# Patient Record
Sex: Male | Born: 1985 | Hispanic: Yes | Marital: Single | Smoking: Never smoker
Health system: Southern US, Community
[De-identification: ages and names within clinical notes are randomized; demographics above are authoritative.]

## PROBLEM LIST (undated history)

## (undated) DIAGNOSIS — F329 Major depressive disorder, single episode, unspecified: Secondary | ICD-10-CM

## (undated) DIAGNOSIS — F32A Depression, unspecified: Secondary | ICD-10-CM

---

## 2017-11-27 ENCOUNTER — Encounter (HOSPITAL_BASED_OUTPATIENT_CLINIC_OR_DEPARTMENT_OTHER): Payer: Self-pay | Admitting: Emergency Medicine

## 2017-11-27 ENCOUNTER — Emergency Department (HOSPITAL_BASED_OUTPATIENT_CLINIC_OR_DEPARTMENT_OTHER): Payer: Worker's Compensation

## 2017-11-27 ENCOUNTER — Emergency Department (HOSPITAL_BASED_OUTPATIENT_CLINIC_OR_DEPARTMENT_OTHER)
Admission: EM | Admit: 2017-11-27 | Discharge: 2017-11-27 | Disposition: A | Discharge status: Home / Self Care | Payer: Worker's Compensation | Attending: Physician Assistant | Admitting: Physician Assistant

## 2017-11-27 ENCOUNTER — Other Ambulatory Visit: Payer: Self-pay

## 2017-11-27 DIAGNOSIS — Y999 Unspecified external cause status: Secondary | ICD-10-CM | POA: Diagnosis not present

## 2017-11-27 DIAGNOSIS — Y9389 Activity, other specified: Secondary | ICD-10-CM | POA: Diagnosis not present

## 2017-11-27 DIAGNOSIS — Y929 Unspecified place or not applicable: Secondary | ICD-10-CM | POA: Diagnosis not present

## 2017-11-27 DIAGNOSIS — S3992XA Unspecified injury of lower back, initial encounter: Secondary | ICD-10-CM | POA: Diagnosis not present

## 2017-11-27 DIAGNOSIS — M545 Low back pain, unspecified: Secondary | ICD-10-CM

## 2017-11-27 DIAGNOSIS — X501XXA Overexertion from prolonged static or awkward postures, initial encounter: Secondary | ICD-10-CM | POA: Diagnosis not present

## 2017-11-27 HISTORY — DX: Major depressive disorder, single episode, unspecified: F32.9

## 2017-11-27 HISTORY — DX: Depression, unspecified: F32.A

## 2017-11-27 MED ORDER — KETOROLAC TROMETHAMINE 30 MG/ML IJ SOLN
30.0000 mg | Freq: Once | INTRAMUSCULAR | Status: AC
  Administered 2017-11-27: 30 mg via INTRAMUSCULAR
  Filled 2017-11-27: qty 1

## 2017-11-27 MED ORDER — ACETAMINOPHEN 500 MG PO TABS
1000.0000 mg | ORAL_TABLET | Freq: Once | ORAL | Status: AC
  Administered 2017-11-27: 1000 mg via ORAL
  Filled 2017-11-27: qty 2

## 2017-11-27 MED ORDER — IBUPROFEN 600 MG PO TABS: 600.0000 mg | ORAL_TABLET | Freq: Four times a day (QID) | ORAL | 0 refills | Status: AC | PRN

## 2017-11-27 MED ORDER — CYCLOBENZAPRINE HCL 5 MG PO TABS: 5.0000 mg | ORAL_TABLET | Freq: Two times a day (BID) | ORAL | 0 refills | Status: AC | PRN

## 2017-11-27 MED ORDER — DIAZEPAM 5 MG PO TABS
5.0000 mg | ORAL_TABLET | Freq: Once | ORAL | Status: AC
  Administered 2017-11-27: 5 mg via ORAL
  Filled 2017-11-27: qty 1

## 2017-11-27 MED FILL — CYCLOBENZAPRINE 5 MG TABLET: 5 | 10 days supply | Qty: 20 | Fill #0

## 2017-11-27 MED FILL — IBUPROFEN 600 MG TABLET: 600 | 8 days supply | Qty: 30 | Fill #0

## 2017-11-27 NOTE — ED Notes (Signed)
Patient transported to X-ray 

## 2017-11-27 NOTE — ED Notes (Signed)
ED Provider at bedside. 

## 2017-11-27 NOTE — ED Provider Notes (Signed)
[facility] EMERGENCY DEPARTMENT Provider Note   CSN: [id number] Arrival date & time: 11/27/17  [id number]     History   Chief Complaint Chief Complaint  Patient presents with  . Back Pain    HPI Gary Montoya is a 32 y.o. male.  HPI  Patient is a 32 year old male with a history of depression and anxiety presenting for a back injury that occurred at work. Patient works in [employer], and reports that he works in [employer] and was lifting 2 bars of wood while twisting to the right and felt a sudden and progressively worsening pain in his back, particularly on the right side. Patient reports that he was having pain with walking due to the severe pain in the right side of his back. Patient denies having any numbness in his legs, muscular weakness in the legs, saddle anesthesia, loss of bowel or bladder control. Patient denies that prior to this event he had any fever or chills, and does not use IV drugs.  Patient reports that he has been progressively unable to straighten his back due to the pain.  No prior history of back injury.  No remedies attempted prior to arrival for symptoms.  History obtained with [employer] [place] interpreter [place] # [phone] present.   Past Medical History:  Diagnosis Date  . Depression     There are no active problems to display for this patient.   History reviewed. No pertinent surgical history.     Home Medications    Prior to Admission medications   Not on File    Family History History reviewed. No pertinent family history.  Social History Social History   Tobacco Use  . Smoking status: Never Smoker  . Smokeless tobacco: Never Used  Substance Use Topics  . Alcohol use: Yes    Frequency: Never    Comment: occ  . Drug use: No     Allergies   Patient has no known allergies.   Review of Systems Review of Systems  Constitutional: Negative for chills and fever.  HENT: Negative for congestion and rhinorrhea.   Eyes:  Negative for visual disturbance.  Respiratory: Negative for shortness of breath.   Cardiovascular: Negative for chest pain.  Gastrointestinal: Negative for abdominal pain, nausea and vomiting.  Genitourinary: Negative for dysuria and flank pain.  Musculoskeletal: Positive for back pain and myalgias. Negative for gait problem and neck pain.  Skin: Negative for wound.  Neurological: Negative for weakness and numbness.     Physical Exam Updated Vital Signs BP 112/60 (BP Location: Right Arm)   Pulse 60   Temp 98.4 F (36.9 C) (Oral)   Resp 18   Ht 5\' 6"  (1.676 m)   Wt 108.9 kg (240 lb)   SpO2 98%   BMI 38.74 kg/m   Physical Exam  Constitutional: He appears well-developed and well-nourished. No distress.  HENT:  Head: Normocephalic and atraumatic.  Mouth/Throat: Oropharynx is clear and moist.  Eyes: Conjunctivae and EOM are normal. Pupils are equal, round, and reactive to light.  Neck: Normal range of motion. Neck supple.  Cardiovascular: Normal rate, regular rhythm, S1 normal and S2 normal.  No murmur heard. Pulmonary/Chest: Effort normal and breath sounds normal. He has no wheezes. He has no rales.  Abdominal: Soft. He exhibits no distension. There is no tenderness. There is no guarding.  Musculoskeletal: Normal range of motion. He exhibits no edema or deformity.  Spine Exam: Inspection/Palpation: No midline tenderness to palpation of cervical, thoracic, or lumbar spine.  Diffuse right-sided paraspinal muscular tenderness particularly just right of midline.  No crepitus.  No step-off. Strength: 5/5 throughout LE bilaterally (hip flexion/extension, adduction/abduction; knee flexion/extension; foot dorsiflexion/plantarflexion, inversion/eversion; great toe inversion) Sensation: Intact to light touch in proximal and distal LE bilaterally Reflexes: 2+ quadriceps and achilles reflexes Patient exhibits normal and symmetric gait without evidence of foot drop.    Bilateral feet  exhibiting deformities of a hyper-arched foot and toes in hyper dorsiflexion.  Deformities are symmetric  Lymphadenopathy:    He has no cervical adenopathy.  Neurological: He is alert.  Cranial nerves grossly intact. Patient moves extremities symmetrically and with good coordination.  Skin: Skin is warm and dry. No rash noted. No erythema.  Psychiatric: He has a normal mood and affect. His behavior is normal. Judgment and thought content normal.  Nursing note and vitals reviewed.    ED Treatments / Results  Labs (all labs ordered are listed, but only abnormal results are displayed) Labs Reviewed - No data to display  EKG  EKG Interpretation None       Radiology Dg Thoracic Spine 2 View  Result Date: 11/27/2017 CLINICAL DATA:  Slipped at work carrying plywood.  Back pain. EXAM: LUMBAR SPINE - COMPLETE 4+ VIEW; THORACIC SPINE 2 VIEWS COMPARISON:  None. FINDINGS: Thoracic spine: Thoracic vertebral bodies intact and aligned with maintenance of thoracic kyphosis. Intervertebral disc heights preserved, multilevel endplate sclerosis and marginal spurring. No destructive bony lesions. Prevertebral and paraspinal soft tissue planes are non-suspicious. Lumbar spine: Five non rib-bearing lumbar-type vertebral bodies are intact and aligned with maintenance of the lumbar lordosis. Mild L5-S1 disc height loss, likely developmental considering partially sacralized L5 vertebral body. No destructive bony lesions. Sacroiliac joints are symmetric. Included prevertebral and paraspinal soft tissue planes are non-suspicious. IMPRESSION: Thoracic spine: No fracture deformity or malalignment. Mild degenerative change. Lumbar spine: No fracture deformity or malalignment. Electronically Signed   By: Awilda Metro M.D.   On: 11/27/2017 14:03   Dg Lumbar Spine Complete  Result Date: 11/27/2017 CLINICAL DATA:  Slipped at work carrying plywood.  Back pain. EXAM: LUMBAR SPINE - COMPLETE 4+ VIEW; THORACIC SPINE 2  VIEWS COMPARISON:  None. FINDINGS: Thoracic spine: Thoracic vertebral bodies intact and aligned with maintenance of thoracic kyphosis. Intervertebral disc heights preserved, multilevel endplate sclerosis and marginal spurring. No destructive bony lesions. Prevertebral and paraspinal soft tissue planes are non-suspicious. Lumbar spine: Five non rib-bearing lumbar-type vertebral bodies are intact and aligned with maintenance of the lumbar lordosis. Mild L5-S1 disc height loss, likely developmental considering partially sacralized L5 vertebral body. No destructive bony lesions. Sacroiliac joints are symmetric. Included prevertebral and paraspinal soft tissue planes are non-suspicious. IMPRESSION: Thoracic spine: No fracture deformity or malalignment. Mild degenerative change. Lumbar spine: No fracture deformity or malalignment. Electronically Signed   By: Awilda Metro M.D.   On: 11/27/2017 14:03    Procedures Procedures (including critical care time)  Medications Ordered in ED Medications  ketorolac (TORADOL) 30 MG/ML injection 30 mg (30 mg Intramuscular Given 11/27/17 1358)  diazepam (VALIUM) tablet 5 mg (5 mg Oral Given 11/27/17 1401)  acetaminophen (TYLENOL) tablet 1,000 mg (1,000 mg Oral Given 11/27/17 1401)     Initial Impression / Assessment and Plan / ED Course  I have reviewed the triage vital signs and the nursing notes.  Pertinent labs & imaging results that were available during my care of the patient were reviewed by me and considered in my medical decision making (see chart for details).     Patient  is nontoxic-appearing but in acute pain with difficulty moving on initial examination.  Patient demonstrating difficulty to stand and walk due to right sided back pain.  Suspect acute muscle strain.  Patient is neurologically intact in the lower extremities, and there are no concerning features for spinal cord compression due to acute disc rupture.  No abdominal findings concerning for  abdominal cause of flank pain given the mechanism of injury.  Will treat patient symptomatically in the emergency department and be able to assess gait more effectively.  X-rays demonstrating some disc height loss at L5-S1, but no acute abnormality.  Patient ambulating after medication treatment, and do not see need to obtain further imaging.  Patient given return precautions for any weakness, numbness, saddle anesthesia, loss of bowel or bladder control.  Patient instructed to take Flexeril as needed and not to drive, operate machinery, or drink alcohol while taking this medication.  Patient to follow-up with primary physician in 5 days for full clearance to go back to work.  Patient is in understanding and agrees with the plan of care.  Discharge instructions performed with [employer] interpreter, [place], #[id number].  Final Clinical Impressions(s) / ED Diagnoses   Final diagnoses:  Injury of back, initial encounter  Acute right-sided low back pain without sciatica    ED Discharge Orders        Ordered    cyclobenzaprine (FLEXERIL) 5 MG tablet  2 times daily PRN     11/27/17 1628    ibuprofen (ADVIL,MOTRIN) 600 MG tablet  Every 6 hours PRN     11/27/17 1628       Elisha Ponder, PA-C 11/27/17 1913    Abelino Derrick, MD 11/28/17 [id number]

## 2017-11-27 NOTE — Discharge Instructions (Signed)
Please see the information and instructions below regarding your visit.  Your diagnoses today include:  1. Injury of back, initial encounter   2. Acute right-sided low back pain without sciatica    About diagnosis. Most episodes of acute low back pain are self-limited. Your exam was reassuring today that the source of your pain is not affecting the spinal cord and nerves that originate in the spinal cord.    Tests performed today include: See side panel of your discharge paperwork for testing performed today. Vital signs are listed at the bottom of these instructions.   Xrays of the back are without bony abnormalities.  Medications prescribed:    Take any prescribed medications only as prescribed, and any over the counter medications only as directed on the packaging.  You are prescribed Flexeril, a muscle relaxant. Some common side effects of this medication include:  Feeling sleepy.  Dizziness. Take care upon going from a seated to a standing position.  Dry mouth.  Feeling tired or weak.  Hard stools (constipation).  Upset stomach. These are not all of the side effects that may occur. If you have questions about side effects, call your doctor. Call your primary care provider for medical advice about side effects.  This medication can be sedating. Only take this medication as needed. Please do not combine with alcohol. Do not drive or operate machinery while taking this medication.   This medication can interact with some other medications. Make sure to tell any provider you are taking this medication before they prescribe you a new medication.   Please alternate ibuprofen 600 mg and Tylenol 650 mg every 6 hours so you are getting something every 3 hours.  Please stop by the pharmacy and get Salon PAS patches for your back   Home care instructions:   Low back pain gets worse the longer you stay stationary. Please keep moving and walking as tolerated. There are exercises  included in this packet to perform as tolerated for your low back pain.   Apply heat to the areas that are painful. Avoid twisting or bending your trunk to lift something. Do not lift anything above 25 lbs while recovering from this flare of low back pain.  Please follow any educational materials contained in this packet.   Follow-up instructions: Please follow-up with your primary care provider in 5 days for further evaluation of your symptoms if they are not completely improved.   Return instructions:  Please return to the Emergency Department if you experience worsening symptoms.  Please return for any fever or chills in the setting of your back pain, weakness in the muscles of the legs, numbness in your legs and feet that is new or changing, numbness in the area where you wipe, retention of your urine, loss of bowel or bladder control, or problems with walking. Please return if you have any other emergent concerns.  Additional Information:   Your vital signs today were: BP 112/60 (BP Location: Right Arm)    Pulse 60    Temp 98.4 F (36.9 C) (Oral)    Resp 18    Ht 5\' 6"  (1.676 m)    Wt 108.9 kg (240 lb)    SpO2 98%    BMI 38.74 kg/m  If your blood pressure (BP) was elevated on multiple readings during this visit above 130 for the top number or above 80 for the bottom number, please have this repeated by your primary care provider within one month. --------------  Thank  you for allowing us to participate in your care today.

## 2017-11-27 NOTE — ED Triage Notes (Addendum)
Pt c/o mid to lower back pain after slipping while carrying a piece of plywood today at work; difficulty ambulating d/t pain; co-worker [facility]

## 2019-09-17 IMAGING — DX DG LUMBAR SPINE COMPLETE 4+V
5 series · 5 of 5 positions shown · non-contrast
Comparison: None.

CLINICAL DATA: Slipped at work carrying plywood.  Back pain.

EXAM:
LUMBAR SPINE - COMPLETE 4+ VIEW; THORACIC SPINE 2 VIEWS

[l-spine ap]
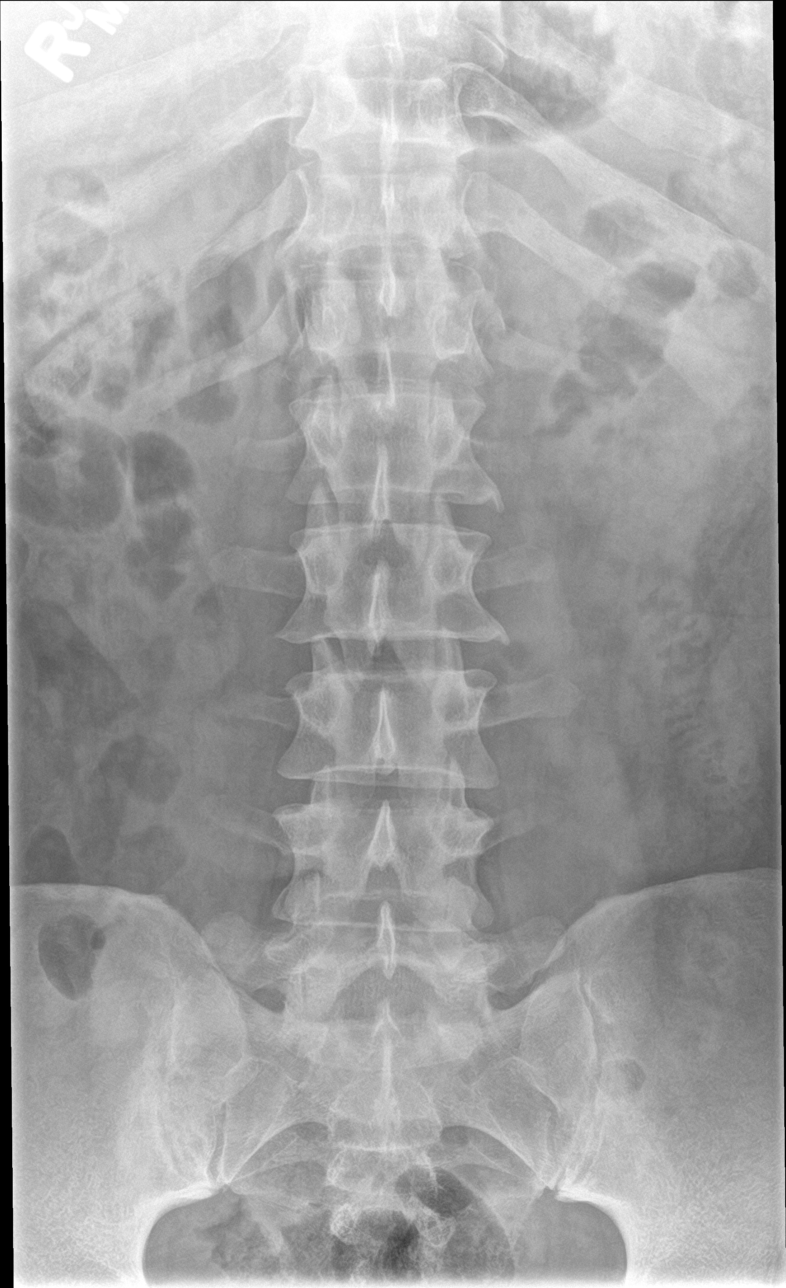

[l-spine obl (1 of 2)]
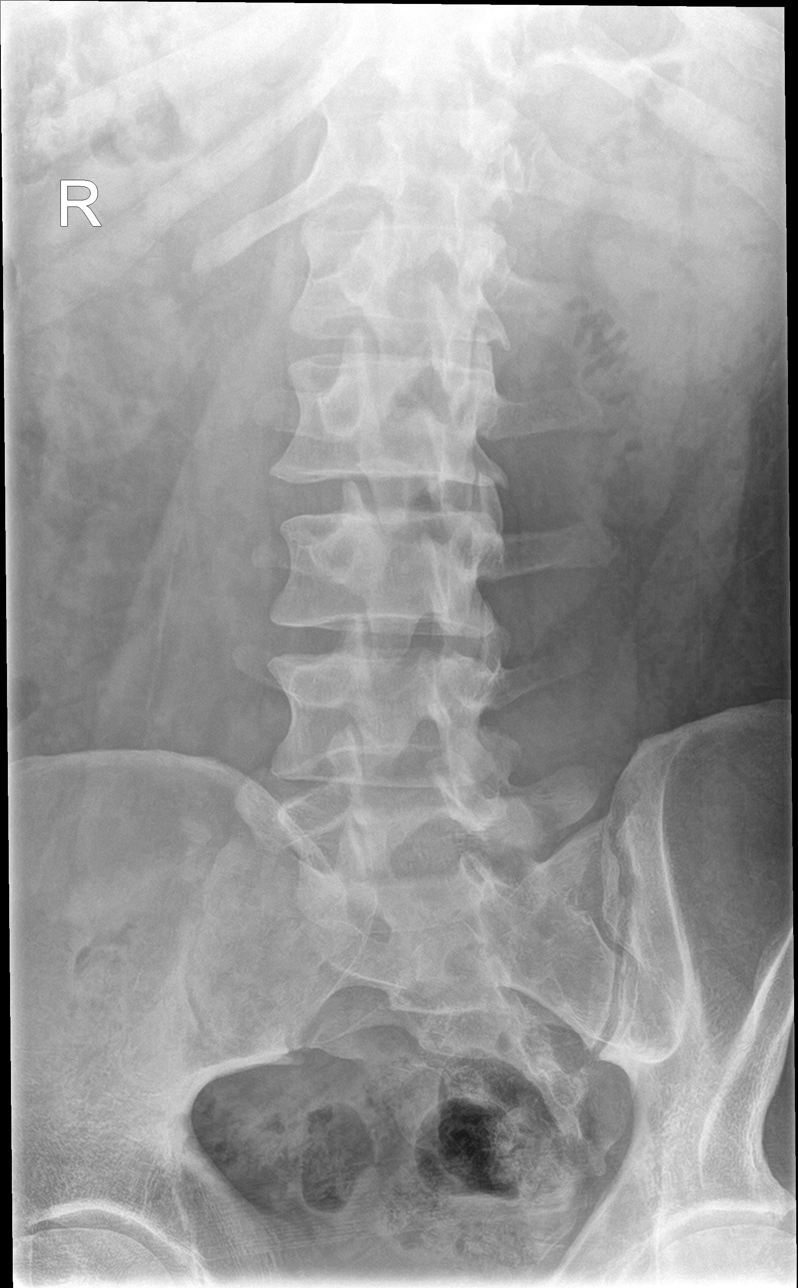

[l-spine obl (2 of 2)]
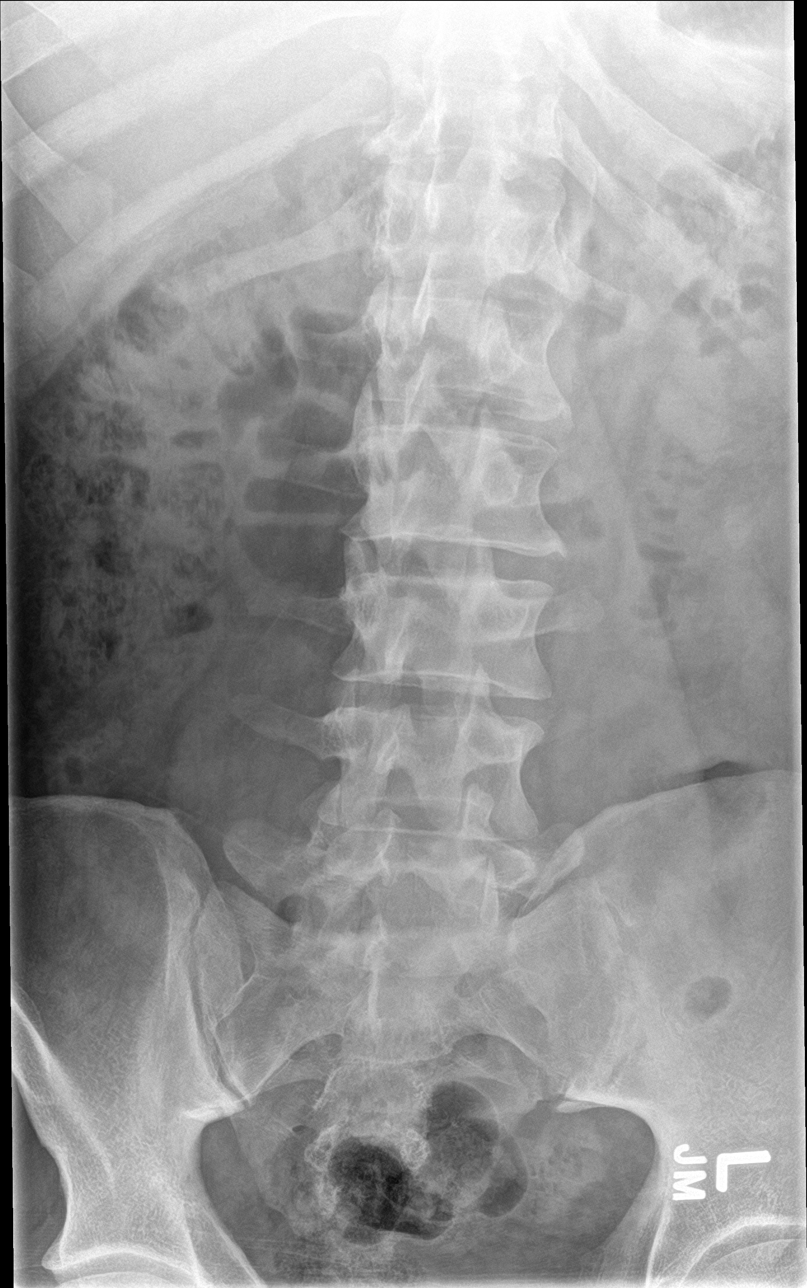

[l-spine lat]
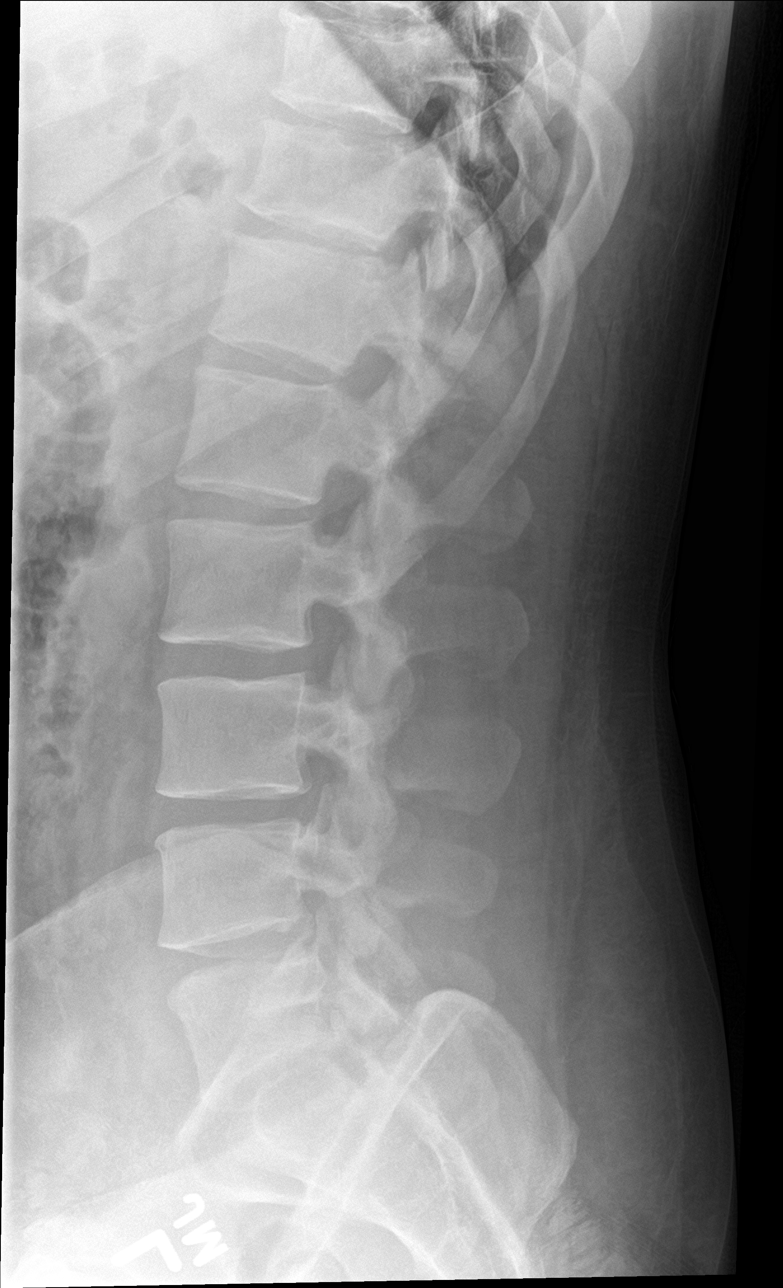

[l-spine spot]
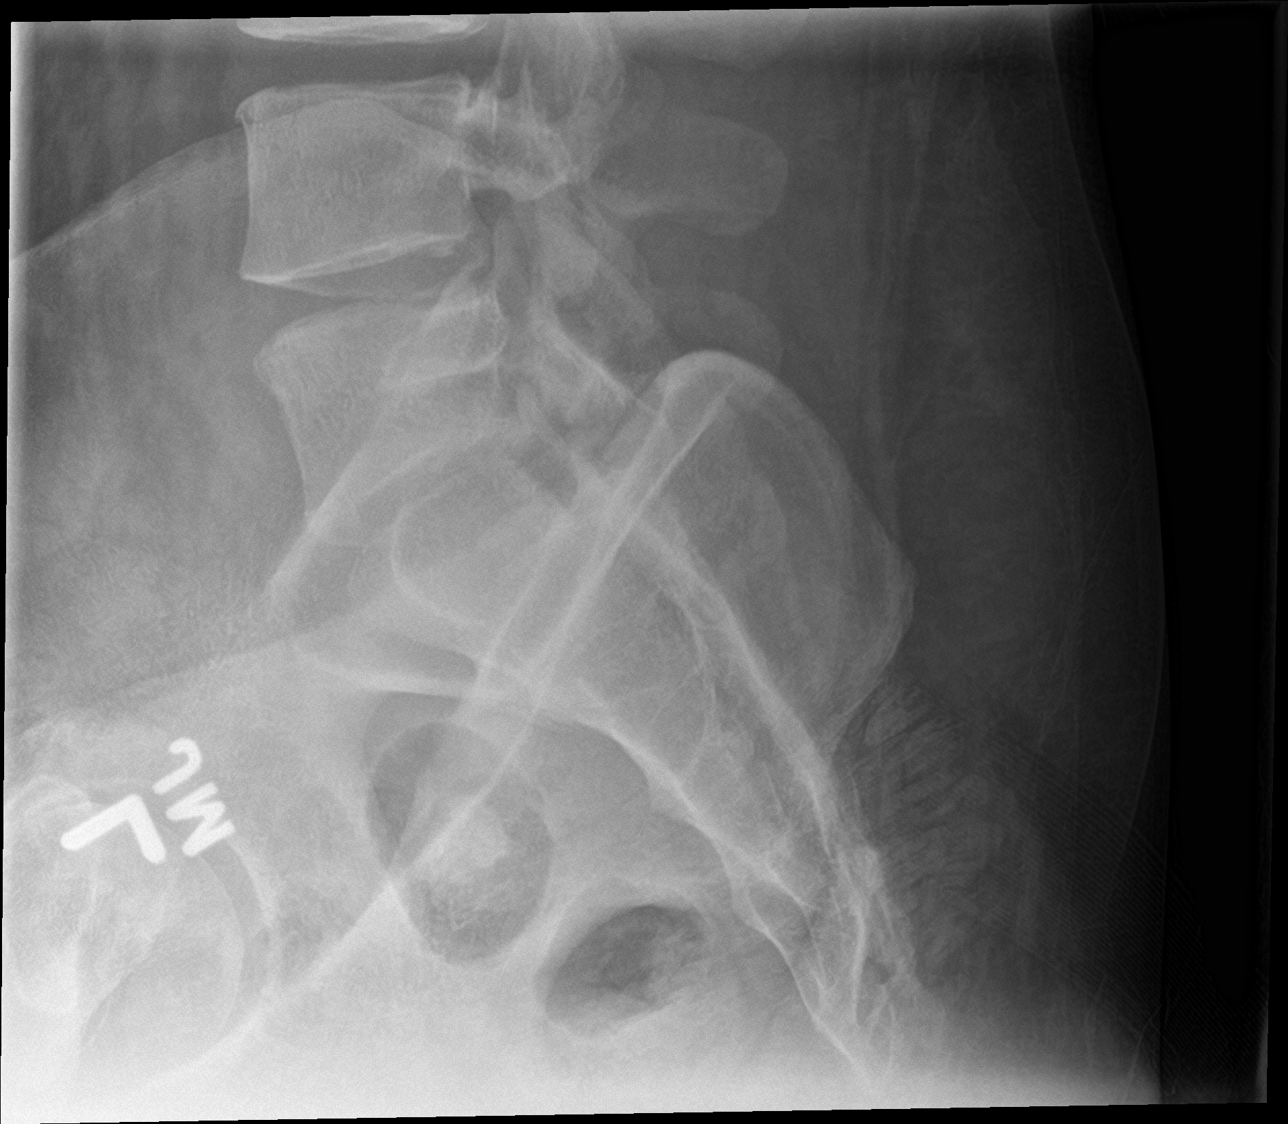

[5 of 5 positions shown; findings below may reference images not displayed]

FINDINGS: Thoracic spine: Thoracic vertebral bodies intact and aligned with
maintenance of thoracic kyphosis. Intervertebral disc heights
preserved, multilevel endplate sclerosis and marginal spurring. No
destructive bony lesions. Prevertebral and paraspinal soft tissue
planes are non-suspicious.

Lumbar spine: Five non rib-bearing lumbar-type vertebral bodies are
intact and aligned with maintenance of the lumbar lordosis. Mild
L5-S1 disc height loss, likely developmental considering partially
sacralized L5 vertebral body. No destructive bony lesions.

Sacroiliac joints are symmetric. Included prevertebral and
paraspinal soft tissue planes are non-suspicious.
IMPRESSION: Thoracic spine: No fracture deformity or malalignment. Mild
degenerative change.

Lumbar spine: No fracture deformity or malalignment.
